# Patient Record
Sex: Male | Born: 1950 | Race: White | Hispanic: No | Marital: Single
Health system: Southern US, Community
[De-identification: ages and names within clinical notes are randomized; demographics above are authoritative.]

---

## 2006-08-10 ENCOUNTER — Ambulatory Visit: Payer: Self-pay | Admitting: Internal Medicine

## 2006-08-11 ENCOUNTER — Ambulatory Visit: Payer: Self-pay | Admitting: *Deleted

## 2006-09-01 ENCOUNTER — Ambulatory Visit: Payer: Self-pay | Admitting: Internal Medicine

## 2010-04-22 ENCOUNTER — Emergency Department (HOSPITAL_COMMUNITY): Admission: EM | Admit: 2010-04-22 | Payer: Self-pay | Source: Home / Self Care | Admitting: Emergency Medicine

## 2010-06-26 ENCOUNTER — Inpatient Hospital Stay (HOSPITAL_COMMUNITY)
Admission: EM | Admit: 2010-06-26 | Discharge: 2010-06-29 | DRG: 906 | Disposition: A | Discharge status: Home / Self Care | Payer: Worker's Compensation | Attending: Orthopedic Surgery | Admitting: Orthopedic Surgery

## 2010-06-26 DIAGNOSIS — J449 Chronic obstructive pulmonary disease, unspecified: Secondary | ICD-10-CM | POA: Diagnosis present

## 2010-06-26 DIAGNOSIS — F17200 Nicotine dependence, unspecified, uncomplicated: Secondary | ICD-10-CM | POA: Diagnosis present

## 2010-06-26 DIAGNOSIS — S5420XA Injury of radial nerve at forearm level, unspecified arm, initial encounter: Secondary | ICD-10-CM | POA: Diagnosis present

## 2010-06-26 DIAGNOSIS — S5400XA Injury of ulnar nerve at forearm level, unspecified arm, initial encounter: Secondary | ICD-10-CM | POA: Diagnosis present

## 2010-06-26 DIAGNOSIS — W3189XA Contact with other specified machinery, initial encounter: Secondary | ICD-10-CM

## 2010-06-26 DIAGNOSIS — IMO0002 Reserved for concepts with insufficient information to code with codable children: Secondary | ICD-10-CM | POA: Diagnosis present

## 2010-06-26 DIAGNOSIS — J4489 Other specified chronic obstructive pulmonary disease: Secondary | ICD-10-CM | POA: Diagnosis present

## 2010-06-26 DIAGNOSIS — Y9269 Other specified industrial and construction area as the place of occurrence of the external cause: Secondary | ICD-10-CM

## 2010-06-26 DIAGNOSIS — S68019A Complete traumatic metacarpophalangeal amputation of unspecified thumb, initial encounter: Principal | ICD-10-CM | POA: Diagnosis present

## 2010-06-26 DIAGNOSIS — S6990XA Unspecified injury of unspecified wrist, hand and finger(s), initial encounter: Secondary | ICD-10-CM | POA: Diagnosis present

## 2010-06-26 DIAGNOSIS — S6980XA Other specified injuries of unspecified wrist, hand and finger(s), initial encounter: Secondary | ICD-10-CM | POA: Diagnosis present

## 2010-06-27 ENCOUNTER — Emergency Department (HOSPITAL_COMMUNITY): Payer: Worker's Compensation

## 2010-06-27 LAB — POCT I-STAT, CHEM 8
BUN: 20 mg/dL (ref 6–23)
Creatinine, Ser: 1.2 mg/dL (ref 0.4–1.5)
Glucose, Bld: 182 mg/dL — ABNORMAL HIGH (ref 70–99)
Hemoglobin: 14.3 g/dL (ref 13.0–17.0)
Potassium: 3.7 mEq/L (ref 3.5–5.1)

## 2010-06-27 LAB — CBC
HCT: 40.5 % (ref 39.0–52.0)
MCH: 31.4 pg (ref 26.0–34.0)
MCHC: 34.1 g/dL (ref 30.0–36.0)
MCV: 92 fL (ref 78.0–100.0)
RDW: 12.9 % (ref 11.5–15.5)

## 2010-06-27 LAB — DIFFERENTIAL
Basophils Absolute: 0 10*3/uL (ref 0.0–0.1)
Eosinophils Relative: 1 % (ref 0–5)
Lymphocytes Relative: 38 % (ref 12–46)
Lymphs Abs: 4.1 10*3/uL — ABNORMAL HIGH (ref 0.7–4.0)
Monocytes Absolute: 0.6 10*3/uL (ref 0.1–1.0)
Monocytes Relative: 6 % (ref 3–12)

## 2010-07-01 NOTE — Op Note (Signed)
Jacob Villa, Jacob Villa                ACCOUNT NO.:  [id number]  MEDICAL RECORD NO.:  [id number]           PATIENT TYPE:  I  LOCATION:  1307                         FACILITY:  [facility]  PHYSICIAN:  Dionne Ano. 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  PROCEDURES: 1. Irrigation and debridement of excisional nature skin, subcutaneous     tissue, tendon, and bone right thumb. 2. Open reduction and internal fixation of proximal phalanx, right     thumb. 3. Radial digital nerve neurolysis and exploration right thumb. 4. Ulnar digital nerve neurolysis and exploration right thumb. 5. Exploration ulnar and radial digital arteries right thumb with     notable stretch injury to the ulnar digital artery and complete     avulsive injury radial digital artery. 6. Stress radiography. 7. Flexor pollicis longus repair right thumb. 8. Rotation flap closure dorsal aspect right thumb. 9. Nail plate removal, partial, right thumb.  SURGEON:  Dominica Severin, MD  ASSISTANT:  None.  COMPLICATIONS:  None.  ANESTHESIA:  General.  TOURNIQUET TIME:  Zero.  ESTIMATED BLOOD LOSS:  Less than 50 mL.  INDICATIONS FOR THE PROCEDURE:  This patient is a 60 year old male who was on the job at [employer] and sustained an injury.  An industrial drill curled up against his thumb.  He sustained a Ringer and avulsion type injury with subsequent open fracture about the proximal phalanx and severe disarray of the soft tissues.  I counseled with him and his significant other  (fiancee) in regards to the risks and benefits of surgery including risk of infection, bleeding, anesthesia, damage to normal structures, and failure of surgery to accomplish its intended goals of relieving symptoms and restoring function.  With this in mind, he desires to proceed.  All questions have been encouraged and answered preoperatively.  I should note that the patient has poor circulatory ability at this juncture, and I have discussed with the patient my high concerns that he may ultimately end up with an amputation given the avulsion and Ringer type mechanism.  Nevertheless, we are going to try everything within our power to try and give him a thumb which is salvageable.  Full risks and benefits were discussed.  DESCRIPTION OF OPERATION:  The patient was seen by myself and Anesthesia, taken to the operative suite.  After smooth induction of general anesthesia, was padded, prepped, and draped in the usual sterile fashion about the right upper extremity with Betadine scrub and paint. I performed the scrub and paint myself as I did not want the thumb to be roughly handled.  I should note that during all parts of the case, I was the only one that specifically performed any aggressive manipulation of the thumb.  Once sterilely prepped  and draped, the patient had time-out called. Sequential compression devices had been placed.  The room temperature was warmed and the operation commenced with irrigation and debridement of skin and subcutaneous tissue.  The patient underwent I and D also of the tendon, both extensor and flexor.  The flexor tendon had injury. The extensor tendon was intact, although there was some longitudinal fraying which was debrided.  The bone was significantly protruding through the skin, and I performed greater than 6 L of fluid through the areas.  The patient had a volar and dorsal injury with significant circulatory compromise.  At this point in time, I made  sure that the excisional debridement was carried out meticulously, handled the soft tissues with extreme care, and was very gentle.  This was an excisional debridement with curette, knife blade, and orthopedic instruments including curette.  Following this, I then performed identification of the fracture.  The fracture was identified.  The most proximal portion of the proximal phalanx was sticking through the volar aspect of the skin.  There was a large bony spike volar in nature about the proximal piece and dorsal in nature about the distal piece.  Given the vessel stretch injury and spasm which I was alerted to via my inspection, at this time, I chose to shorten the bone.  I took a rongeur and clipped off the spikes.  This allowed for removal of the spikes dorsally and volarly and a primary bone shortening.  Following this, I placed a 0.62 K-wire intramedullary, allowed it to exit distal about the dorsal aspect of the thumb just about the eponychium fold region, and following this, I reduced the fracture.  It lined up perfectly on AP and lateral and the bone was then secured via driving the K-wire across the MCP joint.  I had excellent fixation.  I was very pleased with this.  The shortening interdigitated perfectly and this allowed for relaxation of the skin tissues as well as neurovascular structures.  Following this, I performed a radial digital nerve neurolysis.  Radial digital nerve was intact with noted stretch injury.  This was done under Loupe magnification of the 4.0 variety.  Following this, I performed ulnar digital nerve neurolysis in similar fashion under 4.0 Loupe magnification.  It was noted to be intact with significant stretch injury.  Following this, the [employer] had the tendon sheath which was ripped open debrided.  Following this, the [employer] was repaired accordingly.  This was not a complete 100% tear, but enough of the [employer] was torn, the repair process was  accomplished.  The repair was to my satisfaction.  Following this, I took x-rays in the AP and lateral plane.  I was pleased with this and I saved these for final copy documentation.  Following this, I then performed exploration of the vessels.  The ulnar digital nerve was noted to be intact but noted to have stretch injury in my estimation.  The radial digital nerve was noted to have an avulsive injury.  Unfortunately, the avulsion occurred very distally in the thumb at the level of the trifurcation, it was estimated given the coiled up nature.  This was not amenable to any type of formal repair.  I placed papaverine and warm saline against the ulnar digital nerve.  During this time, the thumb did pink.  I felt that the spasm as well as the position of the thumb at the initiation of the procedure led themselves towards the circulatory compromise.  After the reduction and  shortening as well as evaluation of the vessel, it was my hope that the vessel would survive.  Unfortunately, these type of injuries are difficult to predict.  This was certainly a Ringer and avulsive type mechanism which was very violent to the hand in general.  Due to this, I will take all precautions postoperatively.  In reviewing the thumb, the patient did have refill and certainly did not have a cyanotic or dysvascular type appearance at the conclusion. At this time, I was quite pleased.  I placed more warm saline on the thumb and then performed a modified closure loosely with rotation flap given the shortening that had been performed.  I did not have to make back cuts or fore cuts to any extreme but was able to rotate the skin for adequate coverage and only loosely closed all areas.  At this time, I then placed Adaptic and Xeroform, cut the K-wire outside the skin surface, and placed a pin cap.  Following this, I then performed partial nail plate removal, so I could get a good look at the nail bed and viability  and debrided the distal skin as the distal skin was very rough and callused.  This provided good look at the refill and circulatory ability which looked excellent at that juncture.  Nevertheless, I do have concerns that this may or may not remain viable given all issues and his smoking history as well as the mechanism of injury.  Following this, dressing was applied.  A thumb spica splint was placed and he was awoken from anesthesia and taken to recovery room.  We will plan for full precautions of aspirin twice a day, Dextran LMD 40, antibiotics, warm room, no caffeine or chocolate, absolutely no smoking, and I will keep him away from cigarettes indefinitely.  I would give him a guarded prognosis given the severity of his injury and I want to keep a very close eye on this gentleman.  Hopefully, we will be able to salvage his thumb.  Certainly in my estimation given the vessel injury, there are no guarantees.     Dionne Ano. Amanda Pea, M.D.     Nash Mantis  D:  06/27/2010  T:  06/27/2010  Job:  [id number]  Electronically Signed by Dominica Severin M.D. on 07/01/2010 09:20:12 PM

## 2010-09-04 NOTE — Discharge Summary (Signed)
[name], [name]                ACCOUNT NO.:  [id number]  MEDICAL RECORD NO.:  [id number]           PATIENT TYPE:  I  LOCATION:  1307                         FACILITY:  [facility]  PHYSICIAN:  Dionne Ano. Scout Gumbs, M.D.DATE OF BIRTH:  [birth year]  DATE OF ADMISSION:  06/26/2010 DATE OF DISCHARGE:  06/29/2010                              DISCHARGE SUMMARY   ADMITTING DIAGNOSES: 1. Near amputation of right thumb with a ring avulsion mechanism and     significant open fracture, who additionally had soft tissues and     poor circulatory function. 2. History of tobacco abuse.  DISCHARGE DIAGNOSES: 1. Near amputation of right thumb with a ring avulsion mechanism and     significant open fracture, who additionally had soft tissues and     poor circulatory function, improved. 2. History of tobacco abuse.  SURGEON:  Oletta Cohn, MD  PROCEDURE: 1. I and D of the right thumb with ORIF, radial digital nerve     neurolysis. 2. Thumb digital nerve neurolysis exploration of the thumb, [employer] repair     and rotational flap closure.  Nail plate removal.  Partial matrix     about the right thumb.  CONSULTS:  None.  BRIEF HISTORY OF PRESENT ILLNESS:  Mr. Jacob Villa is a very pleasant gentleman, 60 years of age, who presented to the [facility] Emergency Room after sustaining on job injury on June 26, 2010.  He presented for evaluation of the near amputation of his right thumb after a drill/rotational injury to the right thumb given the significant disarray of the thumb and open fracture and vascular compromise, Hand Surgeon and Dominica Severin was consulted.  The patient was noted to have a near amputation of his right thumb with open fractures, significant soft tissue disarray and a vascular compromise.  For these reasons, urgent operative intervention was warranted.  The patient was consented and taken to the operative suite.  The patient was noted to have no abnormalities in terms of his  preoperative labs.  Radiographs at the time in the emergency room staying showed a comminuted open fracture of the proximal phalanx of the thumb.  BRIEF HOSPITAL COURSE:  Mr. Pitstick was admitted on June 26, 2010, and underwent the above procedure per Dominica Severin, MD.  Please see operative report for full details.  There were no intraoperative complications.  He was noted to have near amputation type injury, undergoing I and D, ORIF and rotation flap closure.  The patient was admitted to the Orthopedic Unit given the significant vascular injury, he was started on dextran LMD and general precautions were taken given this was a near replant in terms of keeping the warm room at all times, avoidance of caffeine, chocolate and nicotine.  The patient was admitted to the Orthopedic Unit with standard orthopedic orders for IV pain medications, p.o. pain medications and IV antibiotics.  On postoperative #1, he was doing very well, he was comfortable and without complaint.  No nausea, vomiting, fevers, chills, shortness breath or chest pain.  Chest was clear to auscultation.  Heart with S1-S2.  Abdomen was soft, nontender.  Bowel sounds were stable. The upper extremity showed his splint was intact.  His refill was intact.  He did have gross sensation intact.  No signs of infection at that juncture were noting.  He was voiding and tolerating p.o.'s without difficulties.  The following day, on June 28, 2010, he was doing well, he was tolerating the dextran the Ancef, and was utilizing minimal pain medications at that juncture.  He was alert and oriented.  Vital signs were stable.  He was without complaint.  His examination showed his chest was clear to auscultation.  His heard and lungs were within normal limits.  On examination, abdomen was nontender and was noted be stable with refill less than 2 seconds.  He continued on anticoagulation of dextran and aspirin.  He was tolerating p.o.'s  well, voiding without difficulty and overall in good spirits.  On postoperative day #3, he was doing quite well without significant pain or difficulties.  He denied any nausea, vomiting, fever, or chills.  Heart was regular rate and rhythm.  Abdomen was nontender.  Chest was clear to auscultation.  Upper extremity showed no complicating features.  No signs of infection, refill remained intact.  His splint was clean, dry, and intact. Decision at that juncture was made to discharge him home.  He was weaned off of IV medications and Ancef was given for a 3-day period including preoperatively.  Decision was made to discharge him home in stable condition, but certainly guarded in terms of the PHONE.  ASSESSMENT/FINAL DIAGNOSES:  Status post incision and drainage, open reduction and internal fixation of a near amputation of the right thumb, flexor pollicis longus repair and rotational flap closure.  PLAN:  Decision made to discharge the patient to home in overall stable condition with guarded condition to the PHONE.  His condition on discharge overall was improved.  He will be able to resume a regular diet other than avoiding caffeine, nicotine and chocolate to avoid vasospasm.  In addition, we discussed within the detrimental effects of tobacco.  In terms of his activities, he is going to his splint clean, dry, and intact.  He is going to elevate the upper extremity frequently, keep this warm and will not remove his dressings nor get these wet.  His discharge medications will include aspirin 325 mg one p.o. b.i.d., Percocet 5/325 one-two p.o. q.4-6 p.r.n. pain #40 were dispensed.  We recommend over-the-counter vitamin C 1000 mg daily.  Peri-Colace for stool softener 100 mg one .p.o. b.i.d. and Keflex 500 mg one p.o. q.i.d. x10 days.  He will follow up in our office in 1 week.  He will call [phone] for questions or concerns.  All questions were encouraged and answered.     Karie Chimera, P.A.-C.   ______________________________ Dionne Ano. Amanda Pea, M.D.    BB/MEDQ  D:  08/06/2010  T:  08/07/2010  Job:  [id number]  Electronically Signed by Karie Chimera P.A.-C. on 09/02/2010 09:50:25 PM Electronically Signed by Dominica Severin M.D. on 09/04/2010 07:05:25 PM

## 2011-07-11 ENCOUNTER — Emergency Department (HOSPITAL_COMMUNITY): Payer: Worker's Compensation

## 2011-07-11 ENCOUNTER — Emergency Department (HOSPITAL_COMMUNITY)
Admission: EM | Admit: 2011-07-11 | Discharge: 2011-07-11 | Disposition: A | Discharge status: Home / Self Care | Payer: Worker's Compensation | Attending: Emergency Medicine | Admitting: Emergency Medicine

## 2011-07-11 ENCOUNTER — Encounter (HOSPITAL_COMMUNITY): Payer: Self-pay | Admitting: *Deleted

## 2011-07-11 DIAGNOSIS — S40029A Contusion of unspecified upper arm, initial encounter: Secondary | ICD-10-CM | POA: Insufficient documentation

## 2011-07-11 DIAGNOSIS — S40022A Contusion of left upper arm, initial encounter: Secondary | ICD-10-CM

## 2011-07-11 DIAGNOSIS — S060X9A Concussion with loss of consciousness of unspecified duration, initial encounter: Secondary | ICD-10-CM | POA: Insufficient documentation

## 2011-07-11 DIAGNOSIS — IMO0002 Reserved for concepts with insufficient information to code with codable children: Secondary | ICD-10-CM | POA: Insufficient documentation

## 2011-07-11 DIAGNOSIS — Y9289 Other specified places as the place of occurrence of the external cause: Secondary | ICD-10-CM | POA: Insufficient documentation

## 2011-07-11 DIAGNOSIS — S060XAA Concussion with loss of consciousness status unknown, initial encounter: Secondary | ICD-10-CM | POA: Insufficient documentation

## 2011-07-11 DIAGNOSIS — S8010XA Contusion of unspecified lower leg, initial encounter: Secondary | ICD-10-CM | POA: Insufficient documentation

## 2011-07-11 DIAGNOSIS — S8012XA Contusion of left lower leg, initial encounter: Secondary | ICD-10-CM

## 2011-07-11 DIAGNOSIS — M25529 Pain in unspecified elbow: Secondary | ICD-10-CM | POA: Insufficient documentation

## 2011-07-11 DIAGNOSIS — M25519 Pain in unspecified shoulder: Secondary | ICD-10-CM | POA: Insufficient documentation

## 2011-07-11 DIAGNOSIS — M542 Cervicalgia: Secondary | ICD-10-CM | POA: Insufficient documentation

## 2011-07-11 LAB — CBC
HCT: 40.9 % (ref 39.0–52.0)
Hemoglobin: 14.5 g/dL (ref 13.0–17.0)
MCH: 32.3 pg (ref 26.0–34.0)
MCHC: 35.5 g/dL (ref 30.0–36.0)
MCV: 91.1 fL (ref 78.0–100.0)
RDW: 13.1 % (ref 11.5–15.5)

## 2011-07-11 LAB — ETHANOL: Alcohol, Ethyl (B): <11 mg/dL (ref 0–11)

## 2011-07-11 LAB — DIFFERENTIAL
Basophils Absolute: 0 10*3/uL (ref 0.0–0.1)
Basophils Relative: 0 % (ref 0–1)
Eosinophils Relative: 0 % (ref 0–5)
Monocytes Absolute: 0.7 10*3/uL (ref 0.1–1.0)
Monocytes Relative: 4 % (ref 3–12)

## 2011-07-11 LAB — BASIC METABOLIC PANEL
BUN: 19 mg/dL (ref 6–23)
Calcium: 8.9 mg/dL (ref 8.4–10.5)
Creatinine, Ser: 1.04 mg/dL (ref 0.50–1.35)
GFR calc Af Amer: 90 mL/min — ABNORMAL LOW (ref >90)

## 2011-07-11 LAB — RAPID URINE DRUG SCREEN, HOSP PERFORMED
Amphetamines: NOT DETECTED
Benzodiazepines: NOT DETECTED
Opiates: NOT DETECTED
Tetrahydrocannabinol: NOT DETECTED

## 2011-07-11 LAB — APTT: aPTT: 30 seconds (ref 24–37)

## 2011-07-11 MED ORDER — HYDROCODONE-ACETAMINOPHEN 5-325 MG PO TABS
ORAL_TABLET | ORAL | Status: AC
  Administered 2011-07-11: 1 via ORAL
  Filled 2011-07-11: qty 1

## 2011-07-11 MED ORDER — TRAMADOL HCL 50 MG PO TABS: 50.0000 mg | ORAL_TABLET | Freq: Four times a day (QID) | ORAL | Status: AC | PRN

## 2011-07-11 MED ORDER — IBUPROFEN 600 MG PO TABS: 600.0000 mg | ORAL_TABLET | Freq: Four times a day (QID) | ORAL | Status: AC | PRN

## 2011-07-11 MED ORDER — IBUPROFEN 200 MG PO TABS
ORAL_TABLET | ORAL | Status: AC
  Administered 2011-07-11: 600 mg via ORAL
  Filled 2011-07-11: qty 3

## 2011-07-11 MED ORDER — IBUPROFEN 200 MG PO TABS
600.0000 mg | ORAL_TABLET | Freq: Once | ORAL | Status: AC
  Administered 2011-07-11: 600 mg via ORAL

## 2011-07-11 MED ORDER — HYDROCODONE-ACETAMINOPHEN 5-325 MG PO TABS
1.0000 | ORAL_TABLET | Freq: Once | ORAL | Status: AC
  Administered 2011-07-11: 1 via ORAL

## 2011-07-11 NOTE — ED Provider Notes (Signed)
History     CSN: [id number]  Arrival date & time 07/11/11  0715   First MD Initiated Contact with Patient 07/11/11 [phone]      Chief Complaint  Patient presents with  . Head Injury    (Consider location/radiation/quality/duration/timing/severity/associated sxs/prior treatment) HPI Per EMS pt was struck by steel beam at work. Pt unable to recall the event. No obvious head injury though pt is confused. Disoriented to time. He c/o L arm pain though he has difficulty pinpointing location of pain. He is tearful. He denies alcohol or substance use. No focal neuro deficits History reviewed. No pertinent past medical history.  History reviewed. No pertinent past surgical history.  History reviewed. No pertinent family history.  History  Substance Use Topics  . Smoking status: Not on file  . Smokeless tobacco: Not on file  . Alcohol Use: Not on file      Review of Systems  HENT: Negative for neck pain.   Eyes: Negative for visual disturbance.  Respiratory: Negative for shortness of breath.   Cardiovascular: Negative for chest pain.  Gastrointestinal: Negative for abdominal pain.  Musculoskeletal: Negative for back pain.  Skin: Positive for wound.  Neurological: Negative for dizziness, weakness, numbness and headaches.    Allergies  Review of patient's allergies indicates no known allergies.  Home Medications   Current Outpatient Rx  Name Route Sig Dispense Refill  . IBUPROFEN 600 MG PO TABS Oral Take 1 tablet (600 mg total) by mouth every 6 (six) hours as needed for pain. 30 tablet 0  . TRAMADOL HCL 50 MG PO TABS Oral Take 1 tablet (50 mg total) by mouth every 6 (six) hours as needed for pain. 15 tablet 0    BP 136/90  Pulse 86  Temp(Src) 98.1 F (36.7 C) (Oral)  Resp 20  SpO2 98%  Physical Exam  Nursing note and vitals reviewed. Constitutional: He appears well-developed and well-nourished. No distress.       Tearful, slow to respond to questions  HENT:  Head:  Normocephalic and atraumatic.  Mouth/Throat: Oropharynx is clear and moist.  Eyes: EOM are normal. Pupils are equal, round, and reactive to light.  Neck:       c-collar placed prior to arrival  Cardiovascular: Normal rate and regular rhythm.   Pulmonary/Chest: Effort normal and breath sounds normal. No respiratory distress. He has no wheezes. He has no rales. He exhibits no tenderness.  Abdominal: Soft. Bowel sounds are normal. There is no tenderness. There is no rebound and no guarding.  Musculoskeletal: He exhibits tenderness (Tenderness at L wrist and L shoulder with ROM. No gross deformity . 2+ radial pulses). He exhibits no edema.  Neurological: He is alert.       Oriented to person and place. 5/5 motor, sensation intact  Skin: Skin is warm and dry. No rash noted. No erythema.       Minor superficial lacerations with dried blood to R forearm. No obvious contusions  Psychiatric: He has a normal mood and affect. His behavior is normal.    ED Course  Procedures (including critical care time)  Labs Reviewed  CBC - Abnormal; Notable for the following:    WBC 16.1 (*)    All other components within normal limits  DIFFERENTIAL - Abnormal; Notable for the following:    Neutrophils Relative 83 (*)    Neutro Abs 13.4 (*)    All other components within normal limits  BASIC METABOLIC PANEL - Abnormal; Notable for the following:  Glucose, Bld 124 (*)    GFR calc non Af Amer 77 (*)    GFR calc Af Amer 90 (*)    All other components within normal limits  APTT  PROTIME-INR  ETHANOL  URINE RAPID DRUG SCREEN (HOSP PERFORMED)   Dg Chest 2 View  07/11/2011  *RADIOLOGY REPORT*  Clinical Data: Chest injury.  CHEST - 2 VIEW  Comparison: None.  Findings: The cardiac silhouette, mediastinal and hilar contours are within normal limits.  The lungs are clear of acute process. There are bronchitic changes which are likely related to smoking. No pneumothorax or pleural effusion.  The bony thorax is  intact. No definite acute rib fractures.  The thoracic vertebral bodies are normally aligned.  IMPRESSION:  1.  Bronchitic type lung changes, likely related to smoking. 2.  No acute cardiopulmonary findings and intact bony thorax.  Original Report Authenticated By: P. Loralie Champagne, M.D.   Dg Forearm Left  07/11/2011  *RADIOLOGY REPORT*  Clinical Data: History of injury complaining of left forearm pain.  LEFT FOREARM - 2 VIEW  Comparison: No priors.  Findings: AP and lateral views of the left forearm demonstrate some soft tissue thickening on the dorsum of the forearm.  No acute displaced fractures of the radius or ulna are identified.  There is a large enthesophyte noted off the posterior aspect of the olecranon.  IMPRESSION: 1.  No acute radiographic abnormality of the radius or ulna.  Original Report Authenticated By: Florencia Reasons, M.D.   Dg Wrist Complete Left  07/11/2011  *RADIOLOGY REPORT*  Clinical Data: Left wrist trauma.  LEFT WRIST - COMPLETE 3+ VIEW  Comparison: None  Findings: The joint spaces are maintained.  No acute wrist fracture.  IMPRESSION: No acute fracture.  Original Report Authenticated By: P. Loralie Champagne, M.D.   Ct Head Wo Contrast  07/11/2011  *RADIOLOGY REPORT*  Clinical Data:  Closed head injury.  Large piece of steel fell on patient.  CT HEAD WITHOUT CONTRAST CT CERVICAL SPINE WITHOUT CONTRAST  Technique:  Multidetector CT imaging of the head and cervical spine was performed following the standard protocol without intravenous contrast.  Multiplanar CT image reconstructions of the cervical spine were also generated.  Comparison:   None  CT HEAD  Findings: No evidence for acute hemorrhage, mass lesion, midline shift, hydrocephalus or large infarct.  No acute bony abnormality.  IMPRESSION: Negative head CT.  CT CERVICAL SPINE  Findings: The visualized mastoid air cells are clear.  Negative for a cervical spine fracture.  No evidence for soft tissue swelling in the neck.   Normal alignment of the cervical spine.  Mild degenerative disc changes in lower cervical spine.  No evidence for an apical pneumothorax. There are small blebs or paraseptal emphysematous changes in the lung apices.  IMPRESSION: No acute bone abnormality in the cervical spine.  Original Report Authenticated By: Richarda Overlie, M.D.   Ct Cervical Spine Wo Contrast  07/11/2011  *RADIOLOGY REPORT*  Clinical Data:  Closed head injury.  Large piece of steel fell on patient.  CT HEAD WITHOUT CONTRAST CT CERVICAL SPINE WITHOUT CONTRAST  Technique:  Multidetector CT imaging of the head and cervical spine was performed following the standard protocol without intravenous contrast.  Multiplanar CT image reconstructions of the cervical spine were also generated.  Comparison:   None  CT HEAD  Findings: No evidence for acute hemorrhage, mass lesion, midline shift, hydrocephalus or large infarct.  No acute bony abnormality.  IMPRESSION: Negative head CT.  CT CERVICAL SPINE  Findings: The visualized mastoid air cells are clear.  Negative for a cervical spine fracture.  No evidence for soft tissue swelling in the neck.  Normal alignment of the cervical spine.  Mild degenerative disc changes in lower cervical spine.  No evidence for an apical pneumothorax. There are small blebs or paraseptal emphysematous changes in the lung apices.  IMPRESSION: No acute bone abnormality in the cervical spine.  Original Report Authenticated By: Richarda Overlie, M.D.   Dg Shoulder Left  07/11/2011  *RADIOLOGY REPORT*  Clinical Data: Left shoulder pain.  LEFT SHOULDER - 2+ VIEW  Comparison: None  Findings: The joint spaces are maintained.  No acute bony findings.  IMPRESSION: No acute fracture.  Original Report Authenticated By: P. Loralie Champagne, M.D.     1. Concussion   2. Contusion of arm, left   3. Contusion of left leg       MDM  Pt much more alert. Still amnestic to injury. No midline cervical ttp. FROM at neck. Pt neurologically intact  including A&O x 3. Will observe in ED and if remains stable d/c home.   Pt at baseline mental status. Ambulatory. Given head injury precautions. Return for concerns     Loren Racer, MD 07/11/11 1237

## 2011-07-11 NOTE — ED Notes (Signed)
Per EMS: pt was working at [employer], large piece of steel fell on him, pt does not remember incident.  Initially disoriented to person/time.  No head injury noted.  Lac on right forearm and left forearm, also c/o left leg pain.

## 2011-07-11 NOTE — Discharge Instructions (Signed)
Concussion and Brain Injury A blow or jolt to the head can disrupt the normal function of the brain. This type of brain injury is often called a "concussion" or a "closed head injury." Concussions are usually not life-threatening. Even so, the effects of a concussion can be serious.  CAUSES  A concussion is caused by a blunt blow to the head. The blow might be direct or indirect as described below.  Direct blow (running into another player during a soccer game, being hit in a fight, or hitting your head on a hard surface).   Indirect blow (when your head moves rapidly and violently back and forth like in a car crash).  SYMPTOMS  The brain is very complex. Every head injury is different. Some symptoms may appear right away. Other symptoms may not show up for days or weeks after the concussion. The signs of concussion can be hard to notice. Early on, problems may be missed by patients, family members, and caregivers. You may look fine even though you are acting or feeling differently.  These symptoms are usually temporary, but may last for days, weeks, or even longer. Symptoms include:  Mild headaches that will not go away.   Having more trouble than usual with:   Remembering things.   Paying attention or concentrating.   Organizing daily tasks.   Making decisions and solving problems.   Slowness in thinking, acting, speaking, or reading.   Getting lost or easily confused.   Feeling tired all the time or lacking energy (fatigue).   Feeling drowsy.   Sleep disturbances.   Sleeping more than usual.   Sleeping less than usual.   Trouble falling asleep.   Trouble sleeping (insomnia).   Loss of balance or feeling lightheaded or dizzy.   Nausea or vomiting.   Numbness or tingling.   Increased sensitivity to:   Sounds.   Lights.   Distractions.  Other symptoms might include:  Vision problems or eyes that tire easily.   Diminished sense of taste or smell.   Ringing  in the ears.   Mood changes such as feeling sad, anxious, or listless.   Becoming easily irritated or angry for little or no reason.   Lack of motivation.  DIAGNOSIS  Your caregiver can usually diagnose a concussion or mild brain injury based on your description of your injury and your symptoms.  Your evaluation might include:  A brain scan to look for signs of injury to the brain. Even if the test shows no injury, you may still have a concussion.   Blood tests to be sure other problems are not present.  TREATMENT   People with a concussion need to be examined and evaluated. Most people with concussions are treated in an emergency department, urgent care, or clinic. Some people must stay in the hospital overnight for further treatment.   Your caregiver will send you home with important instructions to follow. Be sure to carefully follow them.   Tell your caregiver if you are already taking any medicines (prescription, over-the-counter, or natural remedies), or if you are drinking alcohol or taking illegal drugs. Also, talk with your caregiver if you are taking blood thinners (anticoagulants) or aspirin. These drugs may increase your chances of complications. All of this is important information that may affect treatment.   Only take over-the-counter or prescription medicines for pain, discomfort, or fever as directed by your caregiver.  PROGNOSIS  How fast people recover from brain injury varies from person to person.   Although most people have a good recovery, how quickly they improve depends on many factors. These factors include how severe their concussion was, what part of the brain was injured, their age, and how healthy they were before the concussion.  Because all head injuries are different, so is recovery. Most people with mild injuries recover fully. Recovery can take time. In general, recovery is slower in older persons. Also, persons who have had a concussion in the past or have  other medical problems may find that it takes longer to recover from their current injury. Anxiety and depression may also make it harder to adjust to the symptoms of brain injury. HOME CARE INSTRUCTIONS  Return to your normal activities slowly, not all at once. You must give your body and brain enough time for recovery.  Get plenty of sleep at night, and rest during the day. Rest helps the brain to heal.   Avoid staying up late at night.   Keep the same bedtime hours on weekends and weekdays.   Take daytime naps or rest breaks when you feel tired.   Limit activities that require a lot of thought or concentration (brain or cognitive rest). This includes:   Homework or job-related work.   Watching TV.   Computer work.   Avoid activities that could lead to a second brain injury, such as contact or recreational sports, until your caregiver says it is okay. Even after your brain injury has healed, you should protect yourself from having another concussion.   Ask your caregiver when you can return to your normal activities such as driving, bicycling, or operating heavy equipment. Your ability to react may be slower after a brain injury.   Talk with your caregiver about when you can return to work or school.   Inform your teachers, school nurse, school counselor, coach, Product/process development scientist, or work Freight forwarder about your injury, symptoms, and restrictions. They should be instructed to report:   Increased problems with attention or concentration.   Increased problems remembering or learning new information.   Increased time needed to complete tasks or assignments.   Increased irritability or decreased ability to cope with stress.   Increased symptoms.   Take only those medicines that your caregiver has approved.   Do not drink alcohol until your caregiver says you are well enough to do so. Alcohol and certain other drugs may slow your recovery and can put you at risk of further injury.    If it is harder than usual to remember things, write them down.   If you are easily distracted, try to do one thing at a time. For example, do not try to watch TV while fixing dinner.   Talk with family members or close friends when making important decisions.   Keep all follow-up appointments. Repeated evaluation of your symptoms is recommended for your recovery.  PREVENTION  Protect your head from future injury. It is very important to avoid another head or brain injury before you have recovered. In rare cases, another injury has lead to permanent brain damage, brain swelling, or death. Avoid injuries by using:  Seatbelts when riding in a car.   Alcohol only in moderation.   A helmet when biking, skiing, skateboarding, skating, or doing similar activities.   Safety measures in your home.   Remove clutter and tripping hazards from floors and stairways.   Use grab bars in bathrooms and handrails by stairs.   Place non-slip mats on floors and in bathtubs.  Improve lighting in dim areas.  SEEK MEDICAL CARE IF:  A head injury can cause lingering symptoms. You should seek medical care if you have any of the following symptoms for more than 3 weeks after your injury or are planning to return to sports:  Chronic headaches.   Dizziness or balance problems.   Nausea.   Vision problems.   Increased sensitivity to noise or light.   Depression or mood swings.   Anxiety or irritability.   Memory problems.   Difficulty concentrating or paying attention.   Sleep problems.   Feeling tired all the time.  SEEK IMMEDIATE MEDICAL CARE IF:  You have had a blow or jolt to the head and you (or your family or friends) notice:  Severe or worsening headaches.   Weakness (even if only in one hand or one leg or one part of the face), numbness, or decreased coordination.   Repeated vomiting.   Increased sleepiness or passing out.   One black center of the eye (pupil) is larger  than the other.   Convulsions (seizures).   Slurred speech.   Increasing confusion, restlessness, agitation, or irritability.   Lack of ability to recognize people or places.   Neck pain.   Difficulty being awakened.   Unusual behavior changes.   Loss of consciousness.  Older adults with a brain injury may have a higher risk of serious complications such as a blood clot on the brain. Headaches that get worse or an increase in confusion are signs of this complication. If these signs occur, see a caregiver right away. MAKE SURE YOU:   Understand these instructions.   Will watch your condition.   Will get help right away if you are not doing well or get worse.  FOR MORE INFORMATION  Several groups help people with brain injury and their families. They provide information and put people in touch with local resources. These include support groups, rehabilitation services, and a variety of health care professionals. Among these groups, the Brain Injury Association (BIA, www.biausa.org) has a Secretary/administrator that gathers scientific and educational information and works on a national level to help people with brain injury.  Document Released: 07/11/2003 Document Revised: 04/09/2011 Document Reviewed: 12/07/2007 [facility] Patient Information 2012 [place], [place].Bone Bruise  A bone bruise is a small hidden fracture of the bone. It typically occurs with bones located close to the surface of the skin.  SYMPTOMS  The pain lasts longer than a normal bruise.   The bruised area is difficult to use.   There may be discoloration or swelling of the bruised area.   When a bone bruise is found with injury to the anterior cruciate ligament (in the knee) there is often an increased:   Amount of fluid in the knee   Time the fluid in the knee lasts.   Number of days until you are walking normally and regaining the motion you had before the injury.   Number of days with pain from the injury.   DIAGNOSIS  It can only be seen on X-rays known as MRIs. This stands for magnetic resonance imaging. A regular X-ray taken of a bone bruise would appear to be normal. A bone bruise is a common injury in the knee and the heel bone (calcaneus). The problems are similar to those produced by stress fractures, which are bone injuries caused by overuse. A bone bruise may also be a sign of other injuries. For example, bone bruises are commonly found where an  anterior cruciate ligament (ACL) in the knee has been pulled away from the bone (ruptured). A ligament is a tough fibrous material that connects bones together to make our joints stable. Bruises of the bone last a lot longer than bruises of the muscle or tissues beneath the skin. Bone bruises can last from days to months and are often more severe and painful than other bruises. TREATMENT Because bone bruises are sudden injuries you cannot often prevent them, other than by being extremely careful. Some things you can do to improve the condition are:  Apply ice to the sore area for 15 to 20 minutes, 3 to 4 times per day while awake for the first 2 days. Put the ice in a plastic bag, and place a towel between the bag of ice and your skin.   Keep your bruised area raised (elevated) when possible to lessen swelling.   For activity:   Use crutches when necessary; do not put weight on the injured leg until you are no longer tender.   You may walk on your affected part as the pain allows, or as instructed.   Start weight bearing gradually on the bruised part.   Continue to use crutches or a cane until you can stand without causing pain, or as instructed.   If a plaster splint was applied, wear the splint until you are seen for a follow-up examination. Rest it on nothing harder than a pillow the first 24 hours. Do not put weight on it. Do not get it wet. You may take it off to take a shower or bath.   If an air splint was applied, more air may be blown  into or out of the splint as needed for comfort. You may take it off at night and to take a shower or bath.   Wiggle your toes in the splint several times per day if you are able.   You may have been given an elastic bandage to use with the plaster splint or alone. The splint is too tight if you have numbness, tingling or if your foot becomes cold and blue. Adjust the bandage to make it comfortable.   Only take over-the-counter or prescription medicines for pain, discomfort, or fever as directed by your caregiver.   Follow all instructions for follow up with your caregiver. This includes any orthopedic referrals, physical therapy, and rehabilitation. Any delay in obtaining necessary care could result in a delay or failure of the bones to heal.  SEEK MEDICAL CARE IF:   You have an increase in bruising, swelling, or pain.   You notice coldness of your toes.   You do not get pain relief with medications.  SEEK IMMEDIATE MEDICAL CARE IF:   Your toes are numb or blue.   You have severe pain not controlled with medications.   If any of the problems that caused you to seek care are becoming worse.  Document Released: 07/11/2003 Document Revised: 04/09/2011 Document Reviewed: 11/23/2007 [facility] Patient Information 2012 [place], [place].

## 2013-02-18 IMAGING — CR DG CHEST 2V
2 series · 2 of 2 positions shown · non-contrast
Comparison: None.

CLINICAL DATA: Chest injury.

CHEST - 2 VIEW

[w chest pa]
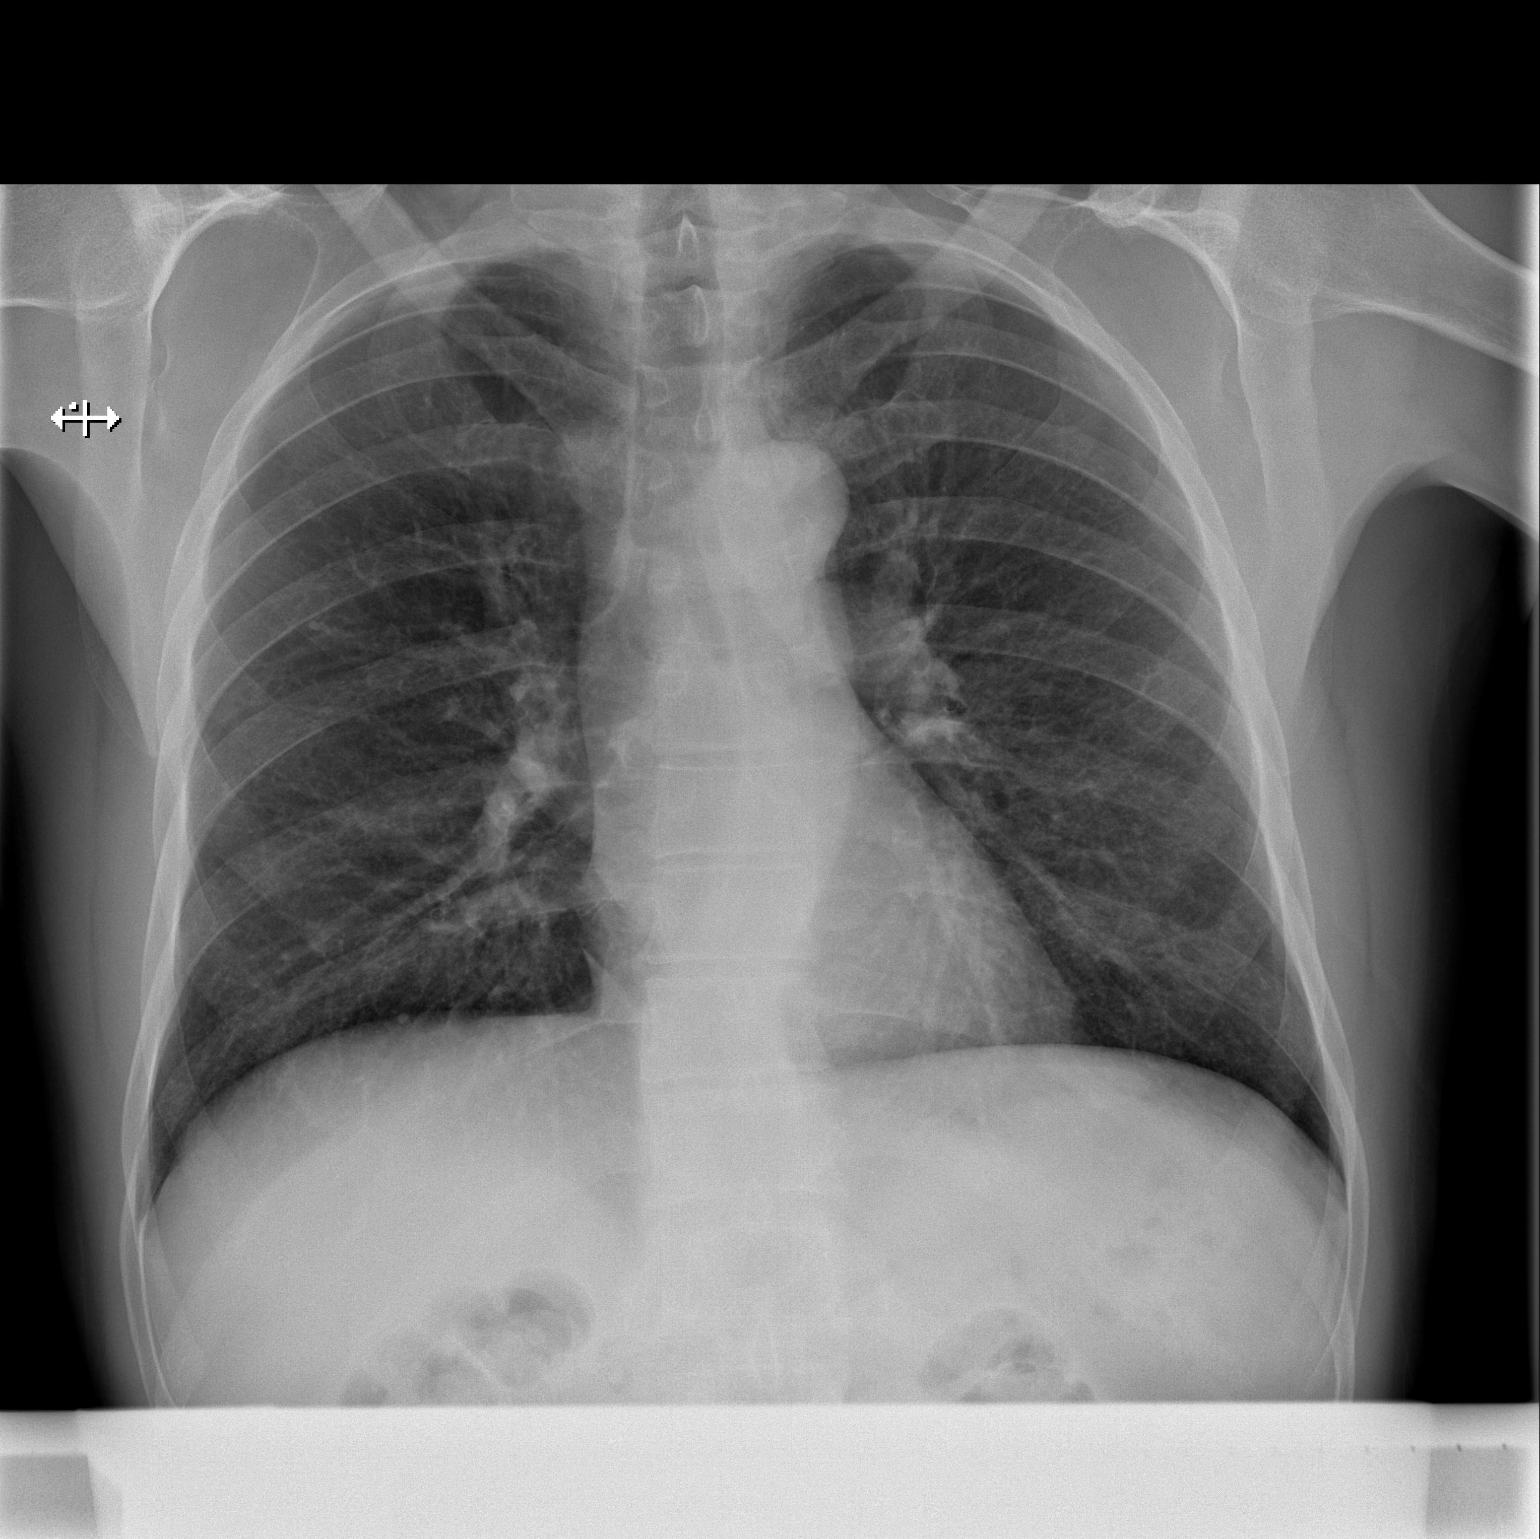

[w chest lat]
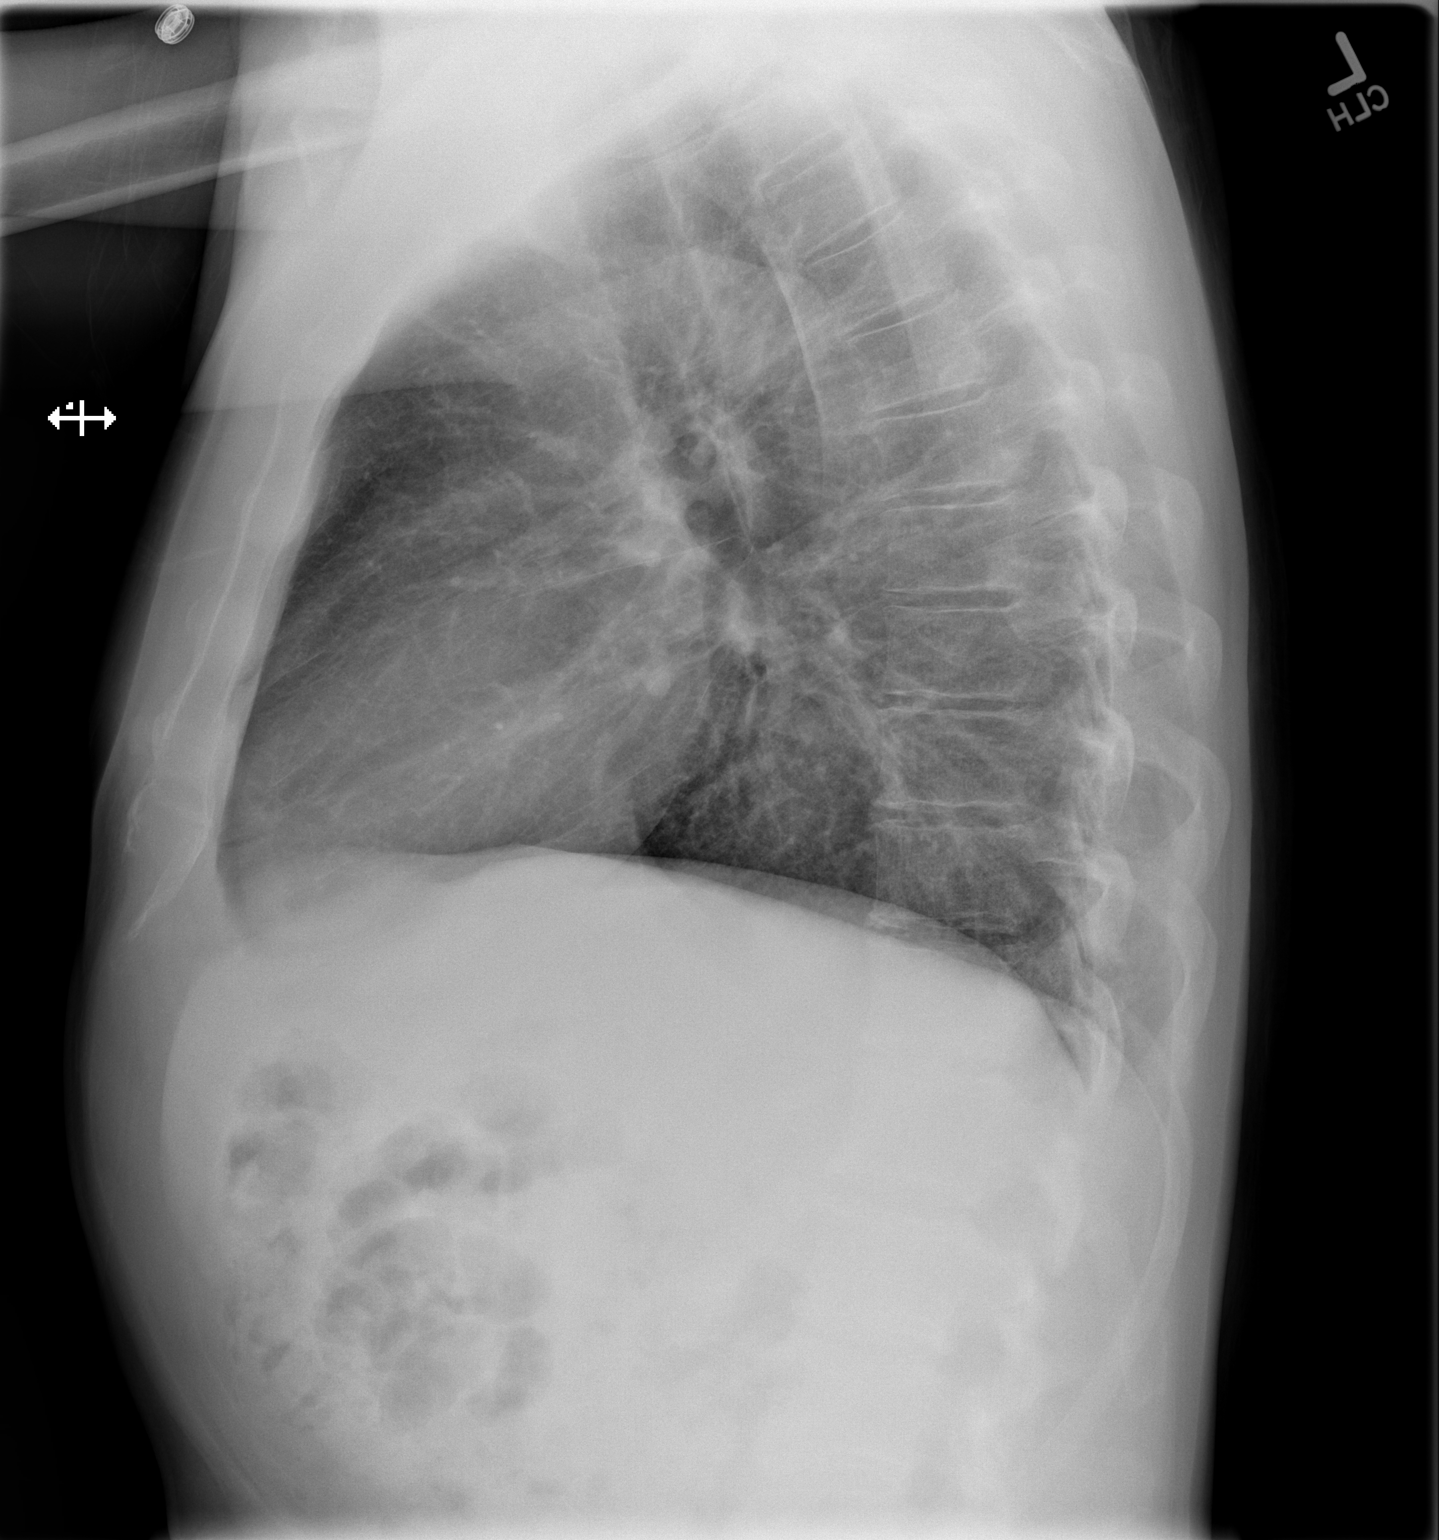

[2 of 2 positions shown; findings below may reference images not displayed]

FINDINGS: The cardiac silhouette, mediastinal and hilar contours
are within normal limits.  The lungs are clear of acute process.
There are bronchitic changes which are likely related to smoking.
No pneumothorax or pleural effusion.  The bony thorax is intact.
No definite acute rib fractures.  The thoracic vertebral bodies are
normally aligned.
IMPRESSION: 1.  Bronchitic type lung changes, likely related to smoking.
2.  No acute cardiopulmonary findings and intact bony thorax.

## 2013-02-18 IMAGING — CT CT HEAD W/O CM
4 of 6 series · 15 of 47 positions shown, 16 images · non-contrast
Comparison: None

CT HEAD

CLINICAL DATA: Closed head injury.  Large piece of steel fell on
patient.

CT HEAD WITHOUT CONTRAST
CT CERVICAL SPINE WITHOUT CONTRAST
TECHNIQUE: Multidetector CT imaging of the head and cervical spine
was performed following the standard protocol without intravenous
contrast.  Multiplanar CT image reconstructions of the cervical
spine were also generated.

[Series 3: head trauma 4.8 h37s · axial · 0.45mm/px · z∈[-93,-4]mm · 3 of 36 slices shown, 4 images]
[im 9/36  brain]
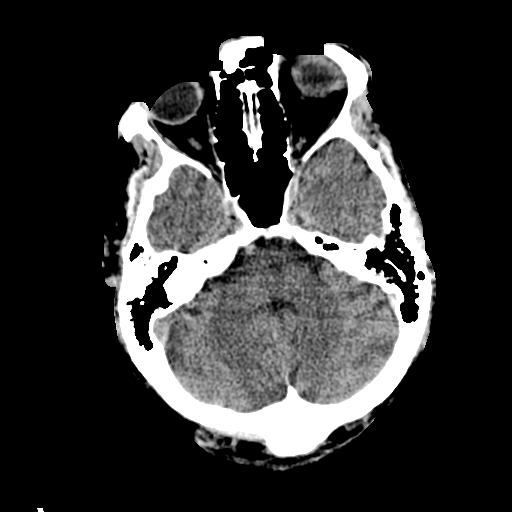
[im 9/36  bone]
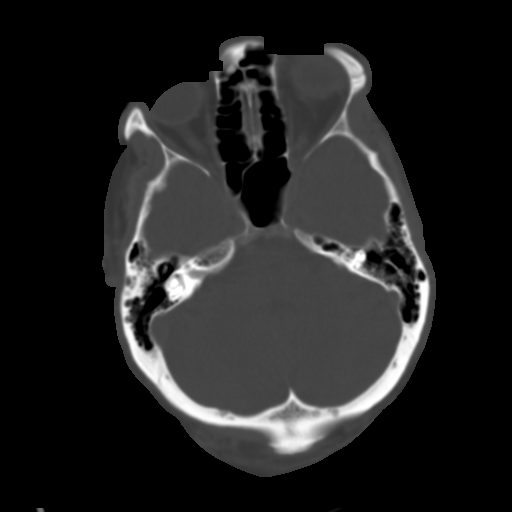
[im 18/36  brain]
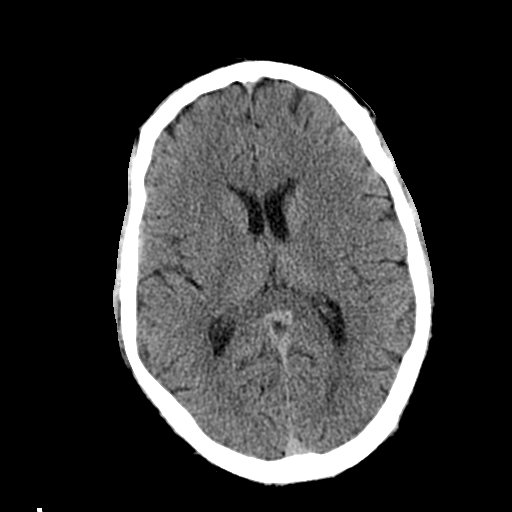
[im 27/36  brain]
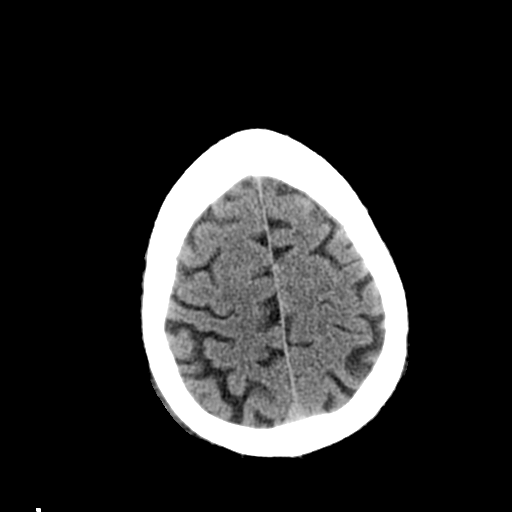

[Series 8: coronals · coronal · 0.27mm/px · 3 of 53 slices shown]
[im 18/53  brain]
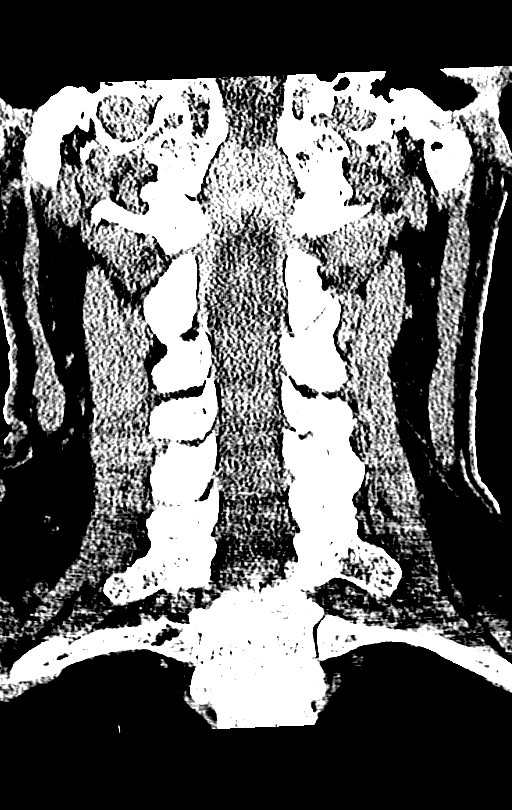
[im 24/53  brain]
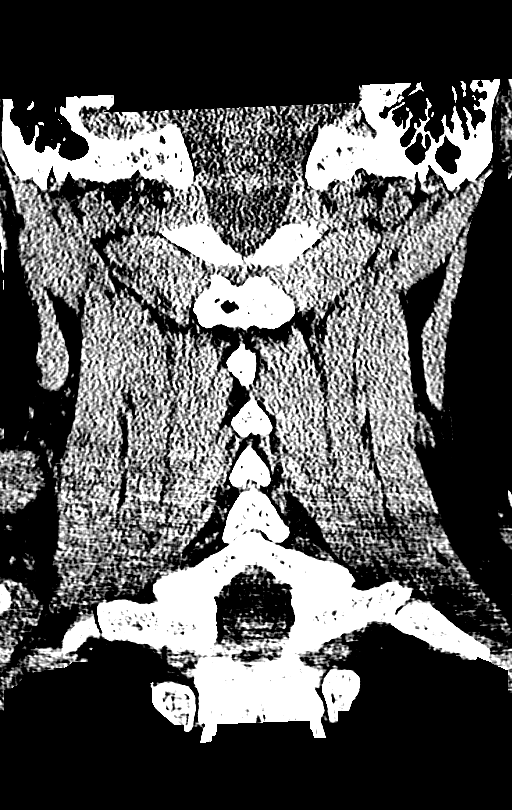
[im 29/53  brain]
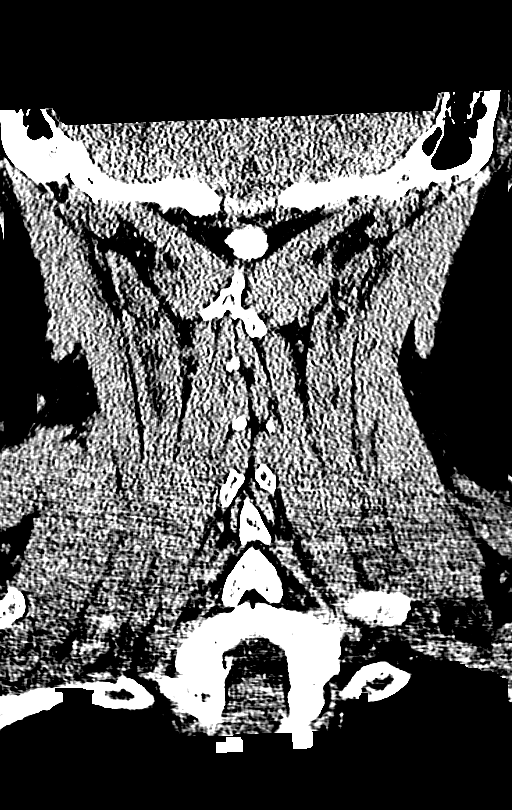

[Series 9: sagittals · sagittal · 0.30mm/px · 3 of 57 slices shown]
[im 19/57  brain]
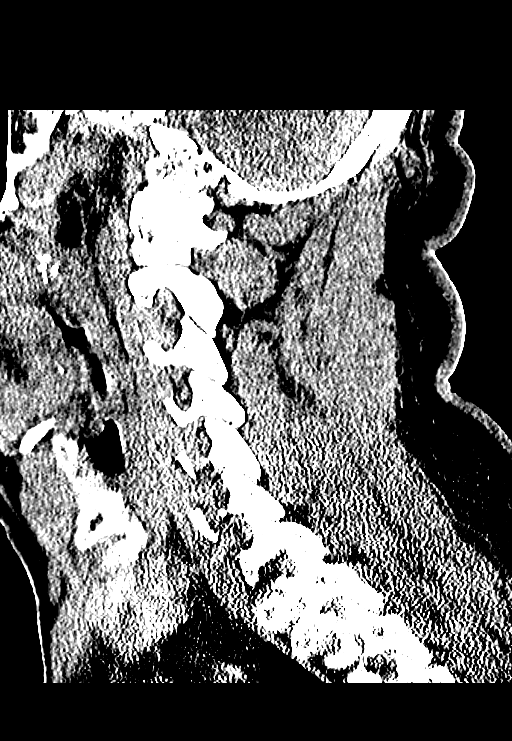
[im 29/57  brain]
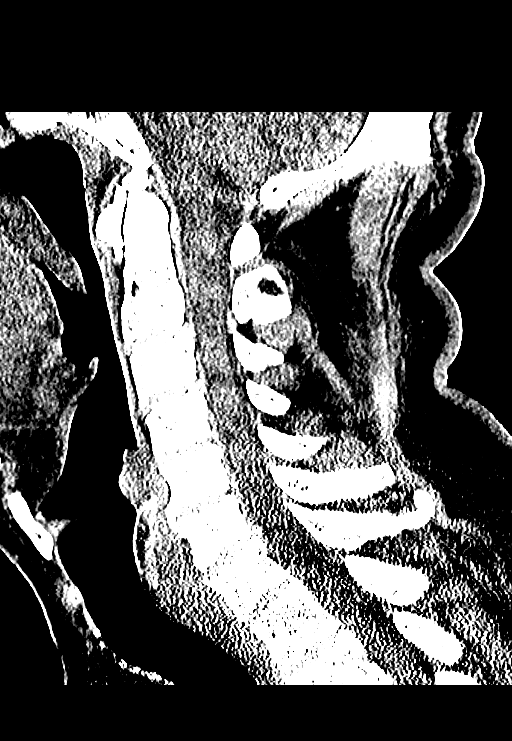
[im 38/57  brain]
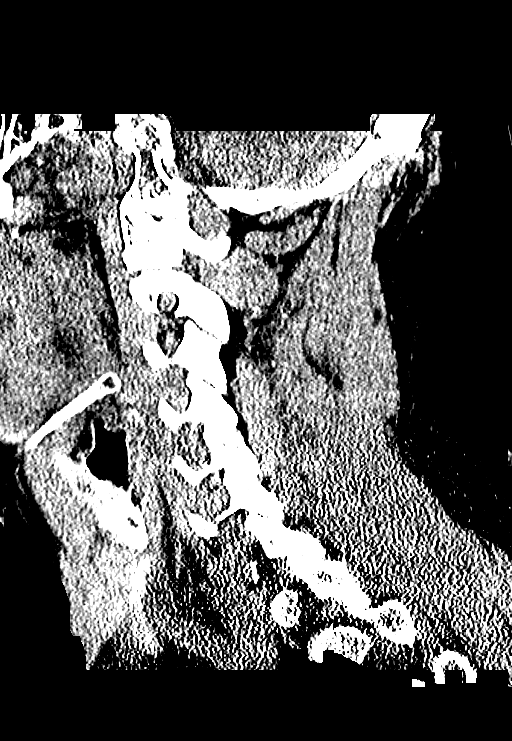

[Series 10: orthogonals · axial · 0.17mm/px · z∈[-251,-172]mm · 6 of 60 slices shown]
[im 9/60  brain]
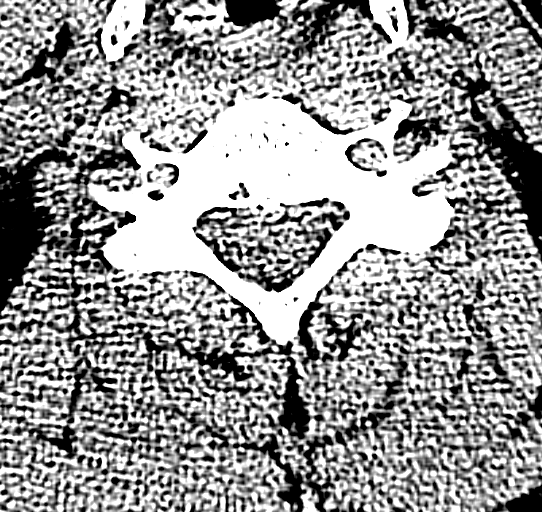
[im 17/60  brain]
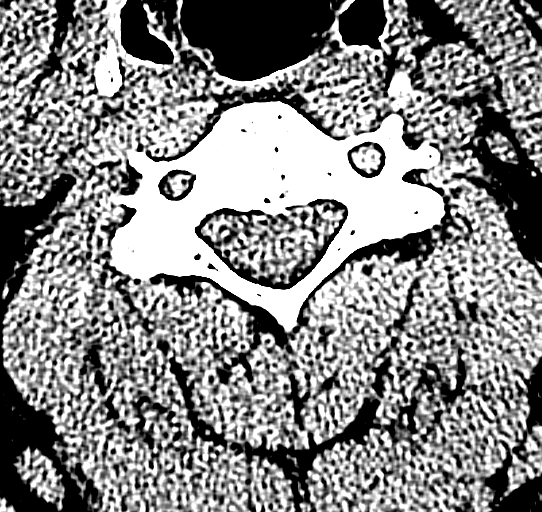
[im 26/60  brain]
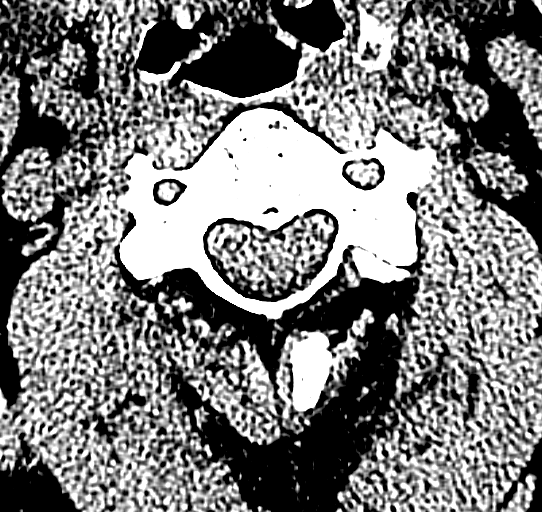
[im 34/60  brain]
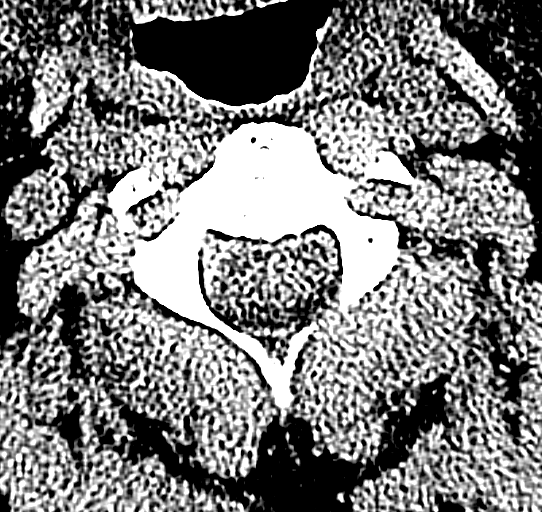
[im 43/60  brain]
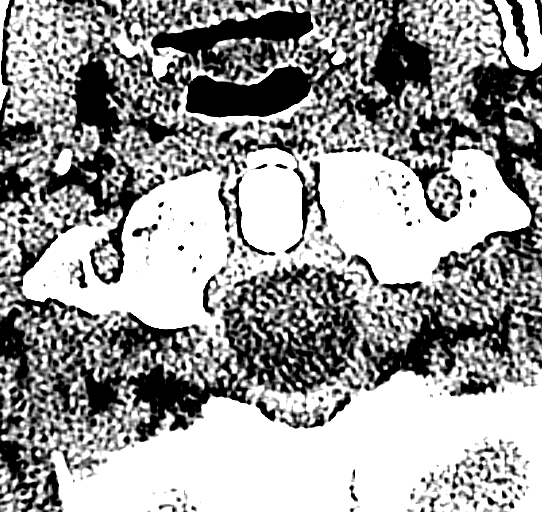
[im 51/60  brain]
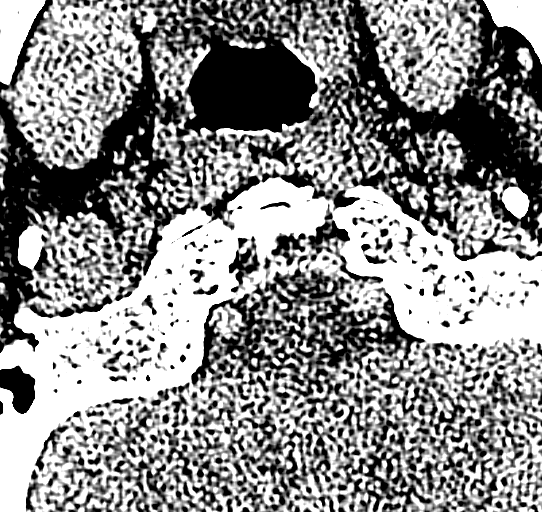

[15 of 47 positions shown; findings below may reference images not displayed]

FINDINGS: No evidence for acute hemorrhage, mass lesion, midline
shift, hydrocephalus or large infarct.  No acute bony abnormality.
IMPRESSION: Negative head CT.

CT CERVICAL SPINE
FINDINGS: The visualized mastoid air cells are clear.  Negative for
a cervical spine fracture.  No evidence for soft tissue swelling in
the neck.  Normal alignment of the cervical spine.  Mild
degenerative disc changes in lower cervical spine.  No evidence for
an apical pneumothorax. There are small blebs or paraseptal
emphysematous changes in the lung apices.
IMPRESSION: No acute bone abnormality in the cervical spine.

## 2014-04-10 ENCOUNTER — Emergency Department (HOSPITAL_COMMUNITY)
Admission: EM | Admit: 2014-04-10 | Discharge: 2014-04-10 | Disposition: A | Discharge status: Home / Self Care | Payer: PRIVATE HEALTH INSURANCE | Attending: Emergency Medicine | Admitting: Emergency Medicine

## 2014-04-10 ENCOUNTER — Encounter (HOSPITAL_COMMUNITY): Payer: Self-pay | Admitting: Emergency Medicine

## 2014-04-10 DIAGNOSIS — R1030 Lower abdominal pain, unspecified: Secondary | ICD-10-CM | POA: Diagnosis present

## 2014-04-10 DIAGNOSIS — K4090 Unilateral inguinal hernia, without obstruction or gangrene, not specified as recurrent: Secondary | ICD-10-CM | POA: Diagnosis not present

## 2014-04-10 MED ORDER — TRAMADOL HCL 50 MG PO TABS: 50.0000 mg | ORAL_TABLET | Freq: Four times a day (QID) | ORAL | Status: AC | PRN

## 2014-04-10 NOTE — ED Provider Notes (Signed)
CSN: [id number]     Arrival date & time 04/10/14  1750 History   First MD Initiated Contact with Patient 04/10/14 1951     Chief Complaint  Patient presents with  . Groin Pain     (Consider location/radiation/quality/duration/timing/severity/associated sxs/prior Treatment) Patient is a 63 y.o. male presenting with groin pain. The history is provided by the patient.  Groin Pain This is a chronic problem. The current episode started in the past 7 days. The problem occurs intermittently. The problem has been gradually worsening. Associated symptoms include urinary symptoms. Pertinent negatives include no abdominal pain, fever or nausea. The symptoms are aggravated by bending. The treatment provided no relief.    History reviewed. No pertinent past medical history. History reviewed. No pertinent past surgical history. History reviewed. No pertinent family history. History  Substance Use Topics  . Smoking status: Not on file  . Smokeless tobacco: Not on file  . Alcohol Use: Not on file    Review of Systems  Constitutional: Negative for fever.  Gastrointestinal: Negative for nausea and abdominal pain.  Genitourinary: Negative for dysuria, scrotal swelling and testicular pain.  All other systems reviewed and are negative.     Allergies  Review of patient's allergies indicates no known allergies.  Home Medications   Prior to Admission medications   Medication Sig Start Date End Date Taking? Authorizing Provider  naproxen sodium (ANAPROX) 220 MG tablet Take 220 mg by mouth daily as needed (for pain).   Yes Historical Provider, MD  traMADol (ULTRAM) 50 MG tablet Take 1 tablet (50 mg total) by mouth every 6 (six) hours as needed. 04/10/14   Arman FilterGail K Lillieann Pavlich, NP   BP 148/91 mmHg  Pulse 78  Temp(Src) 97.9 F (36.6 C)  Resp 20  SpO2 96% Physical Exam  Constitutional: He is oriented to person, place, and time. He appears well-developed and well-nourished.  Eyes: Pupils are equal,  round, and reactive to light.  Cardiovascular: Normal rate and regular rhythm.   Pulmonary/Chest: Effort normal and breath sounds normal.  Abdominal: Soft. Bowel sounds are normal. He exhibits no distension. There is no tenderness. A hernia is present. Hernia confirmed positive in the left inguinal area.  Genitourinary: Penis normal.     Neurological: He is alert and oriented to person, place, and time.  Skin: Skin is warm.  Nursing note and vitals reviewed.   ED Course  Procedures (including critical care time) Labs Review Labs Reviewed - No data to display  Imaging Review No results found.   EKG Interpretation None      MDM  Easily reducible L inguinal hernia  Final diagnoses:  Left inguinal hernia         Arman FilterGail K Marylene Masek, NP 04/10/14 2034  Warnell Foresterrey Wofford, MD 04/14/14 1450

## 2014-04-10 NOTE — Discharge Instructions (Signed)
Hernia A hernia happens when an organ inside your body pushes out through a weak spot in your belly (abdominal) wall. Most hernias get worse over time. They can often be pushed back into place (reduced). Surgery may be needed to repair hernias that cannot be pushed into place. HOME CARE  Keep doing normal activities.  Avoid lifting more than 10 pounds (4.5 kilograms).  Cough gently and avoid straining. Over time, these things will:  Increase your hernia size.  Irritate your hernia.  Break down hernia repairs.  Stop smoking.  Do not wear anything tight over your hernia. Do not keep the hernia in with an outside bandage.  Eat food that is high in fiber (fruit, vegetables, whole grains).  Drink enough fluids to keep your pee (urine) clear or pale yellow.  Take medicines to make your poop soft (stool softeners) if you cannot poop (constipated). GET HELP RIGHT AWAY IF:   You have a fever.  You have belly pain that gets worse.  You feel sick to your stomach (nauseous) and throw up (vomit).  Your skin starts to bulge out.  Your hernia turns a different color, feels hard, or is tender.  You have increased pain or puffiness (swelling) around the hernia.  You poop more or less often.  Your poop does not look the way normally does.  You have watery poop (diarrhea).  You cannot push the hernia back in place by applying gentle pressure while lying down. MAKE SURE YOU:   Understand these instructions.  Will watch your condition.  Will get help right away if you are not doing well or get worse. Document Released: 10/08/2009 Document Revised: 07/13/2011 Document Reviewed: 10/08/2009 [facility] Patient Information 2015 [place], [place]. This information is not intended to replace advice given to you by your health care provider. Make sure you discuss any questions you have with your health care provider. [facility] to [facility] an [facility] for evaluation and  treatment

## 2014-04-10 NOTE — ED Notes (Signed)
Pt c/o left sided groin pain from hernia; pt sts hernia x 1 year increased pain over last several days
